# Patient Record
Sex: Male | Born: 2005 | Race: Black or African American | Hispanic: No | Marital: Single | Smoking: Never smoker
Health system: Southern US, Community
[De-identification: ages and names within clinical notes are randomized; demographics above are authoritative.]

## PROBLEM LIST (undated history)

## (undated) DIAGNOSIS — K029 Dental caries, unspecified: Secondary | ICD-10-CM

## (undated) DIAGNOSIS — K0510 Chronic gingivitis, plaque induced: Secondary | ICD-10-CM

## (undated) DIAGNOSIS — L309 Dermatitis, unspecified: Secondary | ICD-10-CM

## (undated) DIAGNOSIS — A6000 Herpesviral infection of urogenital system, unspecified: Secondary | ICD-10-CM

## (undated) HISTORY — DX: Herpesviral infection of urogenital system, unspecified: A60.00

---
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
## 2023-09-22 ENCOUNTER — Other Ambulatory Visit: Payer: Self-pay

## 2023-09-22 ENCOUNTER — Emergency Department (HOSPITAL_COMMUNITY)
Admission: EM | Admit: 2023-09-22 | Discharge: 2023-09-22 | Disposition: A | Discharge status: Home / Self Care | Attending: Emergency Medicine | Admitting: Emergency Medicine

## 2023-09-22 ENCOUNTER — Encounter (HOSPITAL_COMMUNITY): Payer: Self-pay

## 2023-09-22 DIAGNOSIS — Z91010 Allergy to peanuts: Secondary | ICD-10-CM | POA: Insufficient documentation

## 2023-09-22 DIAGNOSIS — F17200 Nicotine dependence, unspecified, uncomplicated: Secondary | ICD-10-CM | POA: Diagnosis not present

## 2023-09-22 DIAGNOSIS — K029 Dental caries, unspecified: Secondary | ICD-10-CM | POA: Insufficient documentation

## 2023-09-22 DIAGNOSIS — K0889 Other specified disorders of teeth and supporting structures: Secondary | ICD-10-CM | POA: Diagnosis not present

## 2023-09-22 MED ORDER — PENICILLIN V POTASSIUM 500 MG PO TABS: 500.0000 mg | ORAL_TABLET | Freq: Four times a day (QID) | ORAL | 0 refills | Status: AC

## 2023-09-22 MED ORDER — NAPROXEN 500 MG PO TABS
500.0000 mg | ORAL_TABLET | Freq: Two times a day (BID) | ORAL | 0 refills | Status: AC
Start: 2023-09-22 — End: 2023-09-29

## 2023-09-22 MED ORDER — HYDROCODONE-ACETAMINOPHEN 5-325 MG PO TABS
1.0000 | ORAL_TABLET | Freq: Once | ORAL | Status: AC
  Administered 2023-09-22: 1 via ORAL
  Filled 2023-09-22: qty 1

## 2023-09-22 NOTE — Discharge Instructions (Addendum)
 You were given antibiotics to help treat your infection, please take 1 tablet four times a day for the next 7 days.  You need to schedule an appointment with a dentist ASAP.   Please discontinue tobacco use.

## 2023-09-22 NOTE — ED Triage Notes (Signed)
 Pt states bottom left tooth is hurting for 3 days. Denies fevers.

## 2023-09-22 NOTE — ED Provider Notes (Signed)
  EMERGENCY DEPARTMENT AT [facility] Provider Note   CSN: [id number] Arrival date & time: 09/22/23  0840     Patient presents with: Dental Pain   Randy Cunningham is a 18 y.o. male.   18 y.o male with a PMH of tobacco use presents to the ED with a chief complaint of dental pain which has been ongoing for the past 3 to 4 days.  Patient reports pain along the lower aspect of his tooth, has not taken anything for pain aside from unidentified pain pill which did not help with the pain.  Patient does appear sedated.  He reports tobacco use daily.  Has not seen a dentist in some time. No facial swelling, no fever or trouble tolerating his secretions.   The history is provided by the patient.  Dental Pain Associated symptoms: no fever        Prior to Admission medications   Medication Sig Start Date End Date Taking? Authorizing Provider  naproxen  (NAPROSYN ) 500 MG tablet Take 1 tablet (500 mg total) by mouth 2 (two) times daily for 7 days. 09/22/23 09/29/23 Yes Jenavee Laguardia, PA-C  penicillin  v potassium (VEETID) 500 MG tablet Take 1 tablet (500 mg total) by mouth 4 (four) times daily for 7 days. 09/22/23 09/29/23 Yes Ngoc Daughtridge, PA-C    Allergies: Egg-derived products, Fish-derived products, Peanut-containing drug products, and Soap    Review of Systems  Constitutional:  Negative for fever.  HENT:  Positive for dental problem.     Updated Vital Signs BP (!) 147/99 (BP Location: Right Arm)   Pulse 78   Temp 98.7 F (37.1 C)   Resp 12   Ht 5' 9 (1.753 m)   Wt 57.6 kg   SpO2 100%   BMI 18.75 kg/m   Physical Exam Vitals and nursing note reviewed.  Constitutional:      Appearance: Normal appearance.  HENT:     Head: Normocephalic and atraumatic.     Mouth/Throat:     Pharynx: Oropharynx is clear. Uvula midline. No oropharyngeal exudate.     Tonsils: No tonsillar exudate.   Cardiovascular:     Rate and Rhythm: Normal rate.  Pulmonary:     Effort:  Pulmonary effort is normal.  Abdominal:     General: Abdomen is flat.  Musculoskeletal:     Cervical back: Normal range of motion and neck supple.  Skin:    General: Skin is warm and dry.  Neurological:     Mental Status: He is alert and oriented to person, place, and time.     (all labs ordered are listed, but only abnormal results are displayed) Labs Reviewed - No data to display  EKG: None  Radiology: No results found.   Procedures   Medications Ordered in the ED  HYDROcodone -acetaminophen  (NORCO/VICODIN) 5-325 MG per tablet 1 tablet (has no administration in time range)                                    Medical Decision Making Risk Prescription drug management.   Patient presented to the ED with dental pain for the past 4 days.  Has taken occasion to help with his symptoms without any improvement.  Has not seen a dentist in several years, does endorse daily black and mild use, no facial swelling, no fevers. Appears sleepy on exam, will need to follow-up with dentist.  Given a prescription for  antibiotics, also given referral to dental clinic.  Provided with pain medication while in the ED, patient discussed at length.  Patient hemodynamically stable for discharge.  Portions of this note were generated with Scientist, clinical (histocompatibility and immunogenetics). Dictation errors may occur despite best attempts at proofreading.      Final diagnoses:  Pain, dental    ED Discharge Orders          Ordered    penicillin  v potassium (VEETID) 500 MG tablet  4 times daily        09/22/23 1009    naproxen  (NAPROSYN ) 500 MG tablet  2 times daily        09/22/23 1011               Shanina Kepple, PA-C 09/22/23 1023    Dasie Faden, MD 09/23/23 [id number]

## 2023-10-11 ENCOUNTER — Encounter (HOSPITAL_COMMUNITY): Payer: Self-pay

## 2023-10-11 ENCOUNTER — Other Ambulatory Visit: Payer: Self-pay

## 2023-10-11 ENCOUNTER — Emergency Department (HOSPITAL_COMMUNITY)
Admission: EM | Admit: 2023-10-11 | Discharge: 2023-10-11 | Disposition: A | Discharge status: Home / Self Care | Attending: Emergency Medicine | Admitting: Emergency Medicine

## 2023-10-11 DIAGNOSIS — B009 Herpesviral infection, unspecified: Secondary | ICD-10-CM | POA: Diagnosis not present

## 2023-10-11 DIAGNOSIS — Z91010 Allergy to peanuts: Secondary | ICD-10-CM | POA: Diagnosis not present

## 2023-10-11 DIAGNOSIS — A6001 Herpesviral infection of penis: Secondary | ICD-10-CM | POA: Insufficient documentation

## 2023-10-11 DIAGNOSIS — Z202 Contact with and (suspected) exposure to infections with a predominantly sexual mode of transmission: Secondary | ICD-10-CM | POA: Diagnosis not present

## 2023-10-11 DIAGNOSIS — N4889 Other specified disorders of penis: Secondary | ICD-10-CM | POA: Diagnosis present

## 2023-10-11 MED ORDER — DOXYCYCLINE HYCLATE 100 MG PO CAPS: 100.0000 mg | ORAL_CAPSULE | Freq: Two times a day (BID) | ORAL | 0 refills | Status: AC

## 2023-10-11 MED ORDER — CEFTRIAXONE SODIUM 500 MG IJ SOLR
500.0000 mg | Freq: Once | INTRAMUSCULAR | Status: AC
  Administered 2023-10-11: 500 mg via INTRAMUSCULAR
  Filled 2023-10-11: qty 500

## 2023-10-11 MED ORDER — VALACYCLOVIR HCL 1 G PO TABS: 1000.0000 mg | ORAL_TABLET | Freq: Two times a day (BID) | ORAL | 0 refills | Status: AC

## 2023-10-11 MED ORDER — DOXYCYCLINE HYCLATE 100 MG PO TABS
100.0000 mg | ORAL_TABLET | Freq: Once | ORAL | Status: AC
  Administered 2023-10-11: 100 mg via ORAL
  Filled 2023-10-11: qty 1

## 2023-10-11 MED ORDER — VALACYCLOVIR HCL 500 MG PO TABS
1000.0000 mg | ORAL_TABLET | Freq: Once | ORAL | Status: AC
Start: 2023-10-11 — End: 2023-10-11
  Administered 2023-10-11: 1000 mg via ORAL
  Filled 2023-10-11: qty 2

## 2023-10-11 NOTE — ED Notes (Signed)
 This RN has explained to patient that there is an order for blood to be drawn. This RN educated patient on reason for labs but patient refuses. Patient is agreeable to attempting for urine sample at this time. Specimen cup given to patient. Message sent to EDP.

## 2023-10-11 NOTE — ED Provider Notes (Signed)
 [place] EMERGENCY DEPARTMENT AT [facility] Provider Note   CSN: [id number] Arrival date & time: 10/11/23  8167     Patient presents with: Penis Pain and SEXUALLY TRANSMITTED DISEASE   Randy Cunningham is a 18 y.o. male.  Patient presents the emergency department with concerns of painful sore on penis.  He is requesting STI testing.  States that he has a partner as of 1 month ago and has developed several sores to the underside of the corona of the penis.  Endorses some pain with these areas and he was attributing it to likely friction from sexual activity.  No reported rash, ulcer, or testicular pain.  Reports previously having chlamydia and states that he does not currently have any urinary discomfort or urethral discharge.  Unclear if partner's STI status.    Penis Pain       Prior to Admission medications   Medication Sig Start Date End Date Taking? Authorizing Provider  doxycycline  (VIBRAMYCIN ) 100 MG capsule Take 1 capsule (100 mg total) by mouth 2 (two) times daily for 7 days. 10/11/23 10/18/23 Yes Demeisha Geraghty A, PA-C  valACYclovir  (VALTREX ) 1000 MG tablet Take 1 tablet (1,000 mg total) by mouth 2 (two) times daily for 7 days. 10/11/23 10/18/23 Yes Yeray Tomas A, PA-C    Allergies: Egg-derived products, Fish-derived products, Peanut-containing drug products, and Soap    Review of Systems  Genitourinary:  Positive for penile pain.  All other systems reviewed and are negative.   Updated Vital Signs BP 129/87   Pulse 84   Temp 98.8 F (37.1 C)   Resp 16   Ht 5' 9 (1.753 m)   Wt 59 kg   SpO2 98%   BMI 19.20 kg/m   Physical Exam Vitals and nursing note reviewed. Exam conducted with a chaperone present.  Constitutional:      General: He is not in acute distress.    Appearance: He is well-developed.  HENT:     Head: Normocephalic and atraumatic.  Eyes:     Conjunctiva/sclera: Conjunctivae normal.  Cardiovascular:     Rate and Rhythm: Normal rate and  regular rhythm.     Heart sounds: No murmur heard. Pulmonary:     Effort: Pulmonary effort is normal. No respiratory distress.     Breath sounds: Normal breath sounds.  Abdominal:     Palpations: Abdomen is soft.     Tenderness: There is no abdominal tenderness.  Genitourinary:    Penis: Circumcised. Lesions present.      Comments: Multiple small ulcerations present to the underside of the corona of the penis on bilateral portions. No fluid filled vesicles. No testicular pain or swelling. Musculoskeletal:        General: No swelling.     Cervical back: Neck supple.  Skin:    General: Skin is warm and dry.     Capillary Refill: Capillary refill takes less than 2 seconds.  Neurological:     Mental Status: He is alert.  Psychiatric:        Mood and Affect: Mood normal.     (all labs ordered are listed, but only abnormal results are displayed) Labs Reviewed  GC/CHLAMYDIA PROBE AMP ([place]) NOT AT [facility]    EKG: None  Radiology: No results found.   Procedures   Medications Ordered in the ED  cefTRIAXone  (ROCEPHIN ) injection 500 mg (has no administration in time range)  valACYclovir  (VALTREX ) tablet 1,000 mg (has no administration in time range)  doxycycline  (VIBRA -TABS)  tablet 100 mg (has no administration in time range)                                    Medical Decision Making Risk Prescription drug management.   This patient presents to the ED for concern of penile pain.  Differential diagnosis includes gonorrhea, chlamydia, syphilis, genital herpes   Lab Tests:  I Ordered, and personally interpreted labs.  The pertinent results include: GC/chlamydia collected and pending   Medicines ordered and prescription drug management:  I ordered medication including Rocephin , doxycycline , Valtrex  for STI concern, genital herpes Reevaluation of the patient after these medicines showed that the patient improved I have reviewed the patients home medicines and have  made adjustments as needed   Problem List / ED Course:  Patient presented to the emergency department concerns of possible STI exposure.  He also Dors is a painful area on the underside of his penis that he was attributing to likely friction from sex.  No prior history of similar lesions.  Denies any dysuria, urethral discharge. On exam, patient has several shallow ulcerations to the underside of the corona of the penis.  This is present multiple areas.  These are clustered together.  Most consistent with genital herpes.  No testicular pain or swelling. Given patient's unclear partner status of possible STI exposure, advise prophylactic treatment with Rocephin , doxycycline .  With patient's finding of genital herpes, also initiate antiviral therapy with Valtrex .  No prior history of genital herpes as far as patient is aware.  Prescription for doxycycline  and Valtrex  sent to pharmacy for use over the next 3 days.  Advised patient to avoid sexual activity until full resolution of symptoms and to use condoms for future use to prevent spread or contraction of any possible STIs.  Patient agreeable with this plan and verbalized understanding.  Otherwise stable for outpatient follow-up and discharge home.   Social Determinants of Health:  None  Final diagnoses:  Possible exposure to STI  Herpes simplex infection of penis    ED Discharge Orders          Ordered    valACYclovir  (VALTREX ) 1000 MG tablet  2 times daily        10/11/23 2010    doxycycline  (VIBRAMYCIN ) 100 MG capsule  2 times daily        10/11/23 2010               Glendale Wherry A, PA-C 10/11/23 2039    Garrick Charleston, MD 10/11/23 2048

## 2023-10-11 NOTE — Discharge Instructions (Addendum)
 You were seen in the ER today for concerns of penile discomfort. You appear to have a rash that is consistent with genital herpes. I have started you on antiviral therapy as well as treatment for gonorrhea and chlamydia. Please take these as prescribed for the next week. Avoid sexual activity for the next 2 weeks and make sure you wear condoms to prevent spreading of STIs or contracting a new infection.

## 2023-10-11 NOTE — ED Triage Notes (Signed)
 Pt reports that he was having sex when he got a scratch on his thing pt clarified with nurse that he has an open wound on his penis.

## 2023-12-17 ENCOUNTER — Ambulatory Visit: Admitting: Family Medicine

## 2023-12-17 ENCOUNTER — Other Ambulatory Visit (HOSPITAL_COMMUNITY)
Admission: RE | Admit: 2023-12-17 | Discharge: 2023-12-17 | Disposition: A | Discharge status: Home / Self Care | Source: Ambulatory Visit | Attending: Family Medicine | Admitting: Family Medicine

## 2023-12-17 ENCOUNTER — Encounter: Payer: Self-pay | Admitting: Family Medicine

## 2023-12-17 VITALS — BP 104/58 | HR 79 | Ht 67.0 in | Wt 143.8 lb

## 2023-12-17 DIAGNOSIS — Z0000 Encounter for general adult medical examination without abnormal findings: Secondary | ICD-10-CM | POA: Diagnosis not present

## 2023-12-17 DIAGNOSIS — Z113 Encounter for screening for infections with a predominantly sexual mode of transmission: Secondary | ICD-10-CM | POA: Insufficient documentation

## 2023-12-17 NOTE — Progress Notes (Signed)
    SUBJECTIVE:   Chief compliant/HPI: annual examination  Randy Cunningham is a 18 y.o. who presents today for an annual exam.   Interested in STD testing, sexually active, uses protection  Denies other concerns Denies recent illness, aches/pains, CP, SOB  OBJECTIVE:   BP (!) 104/58   Pulse 79   Ht 5' 7 (1.702 m)   Wt 143 lb 12.8 oz (65.2 kg)   SpO2 98%   BMI 22.52 kg/m   General: NAD, pleasant, able to participate in exam Cardiac: RRR, no murmurs auscultated Respiratory: CTAB, normal WOB Abdomen: soft, non-tender, non-distended, normoactive bowel sounds Extremities: warm and well perfused, no edema or cyanosis Skin: warm and dry, no rashes noted Neuro: alert, no obvious focal deficits, speech normal Psych: Normal affect and mood  ASSESSMENT/PLAN:   Assessment & Plan Annual physical exam No significant concerns today Urine Screening for STD Annual Examination  See AVS for age appropriate recommendations  PHQ score 0, reviewed and discussed.  Blood pressure reviewed and at goal.     Considered the following items based upon USPSTF recommendations: Diabetes screening: recently normal in 2022, pt declines today HIV testing:discussed and declined Hepatitis C: discussed and declined Syphilis if at high risk: discussed and declined GC/CT 24 or  younger, ordered. Lipid panel (nonfasting or fasting) discussed based upon AHA recommendations and patient declined.  Consider repeat every 4-6 years.  Reviewed risk factors for latent tuberculosis and not indicated Vaccinations pt declined meningococcal today, will consider and let us  know if he wants it soon.   Follow up in 1 year or sooner if indicated.  MyChart Activation: Already signed up  Payton Coward, MD [facility]

## 2023-12-17 NOTE — Patient Instructions (Signed)
 If any of your results from today are abnormal and/or require changes to your medical care, I will give you a call. Otherwise, I will send you a letter in the mail or a message on MyChart.

## 2023-12-21 ENCOUNTER — Ambulatory Visit: Payer: Self-pay | Admitting: Family Medicine

## 2023-12-21 LAB — URINE CYTOLOGY ANCILLARY ONLY
Chlamydia: NEGATIVE
Comment: NEGATIVE
Comment: NEGATIVE
Comment: NORMAL
Neisseria Gonorrhea: NEGATIVE
Trichomonas: NEGATIVE

## 2024-01-07 NOTE — Telephone Encounter (Signed)
 Patient's mother calls nurse line requesting that we mail patient his recent STI results.   She verified his name, DOB and address. She is requesting that these results be sent via letter in mail, as patient is not able to access his mychart.   Spoke with research officer, political party. Patient would need to make request himself, given that he is no longer a minor.   Called mother and advised. She attempted to reach him via 3 way call, however, he did not answer.   Patient will either call back tomorrow or attempt to access his mychart.    Chiquita JAYSON English, RN
